# Patient Record
Sex: Male | Born: 1975 | Race: Black or African American | Hispanic: No | Marital: Married | Smoking: Former smoker
Health system: Southern US, Community
[De-identification: ages and names within clinical notes are randomized; demographics above are authoritative.]

---

## 2007-07-13 ENCOUNTER — Ambulatory Visit: Payer: Self-pay | Admitting: Internal Medicine

## 2007-07-15 ENCOUNTER — Emergency Department: Payer: Self-pay | Admitting: Emergency Medicine

## 2007-07-19 ENCOUNTER — Emergency Department: Payer: Self-pay | Admitting: Emergency Medicine

## 2007-07-20 ENCOUNTER — Ambulatory Visit: Payer: Self-pay | Admitting: Family Medicine

## 2007-07-21 ENCOUNTER — Ambulatory Visit: Payer: Self-pay | Admitting: Family Medicine

## 2010-01-07 IMAGING — CR RIGHT FOOT COMPLETE - 3+ VIEW
1 series · 3 of 3 positions shown · non-contrast
Comparison: none

REASON FOR EXAM: swelling pain
COMMENTS:   LMP: (Male)

[Series 1: view not recorded · 0.17mm/px · 3 of 3 slices shown]
[im 1/3]
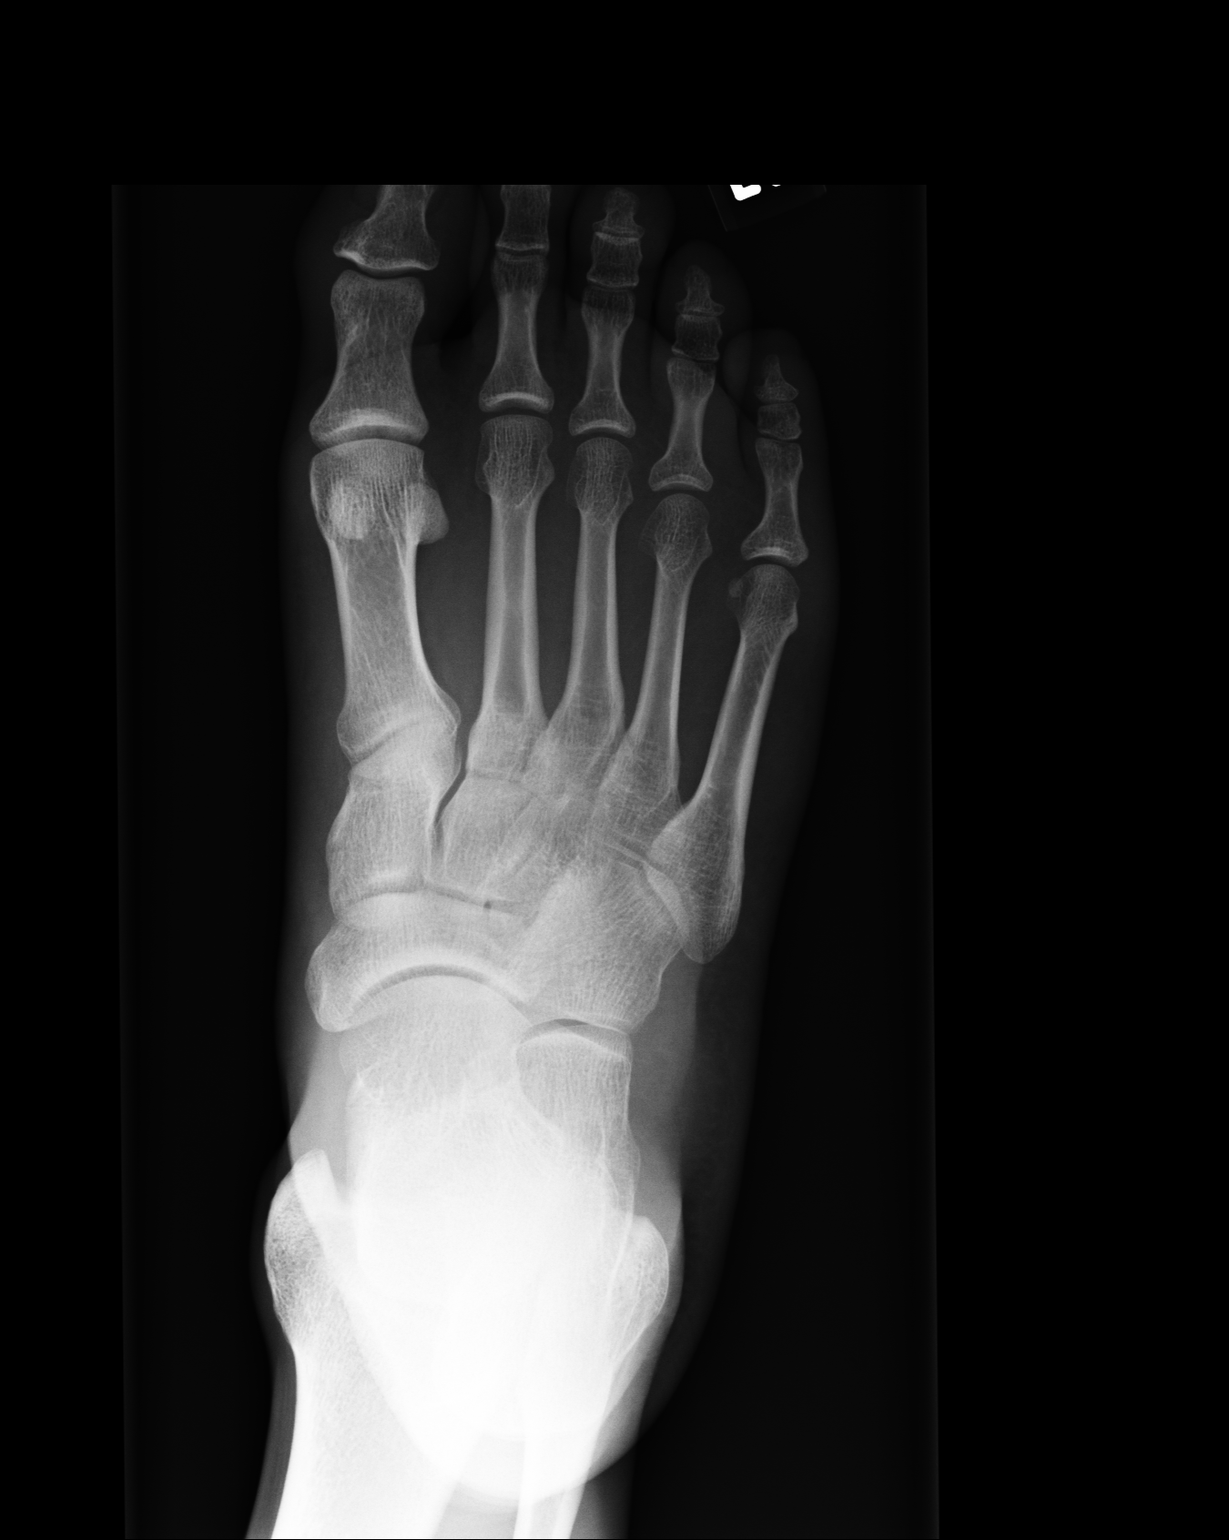
[im 2/3]
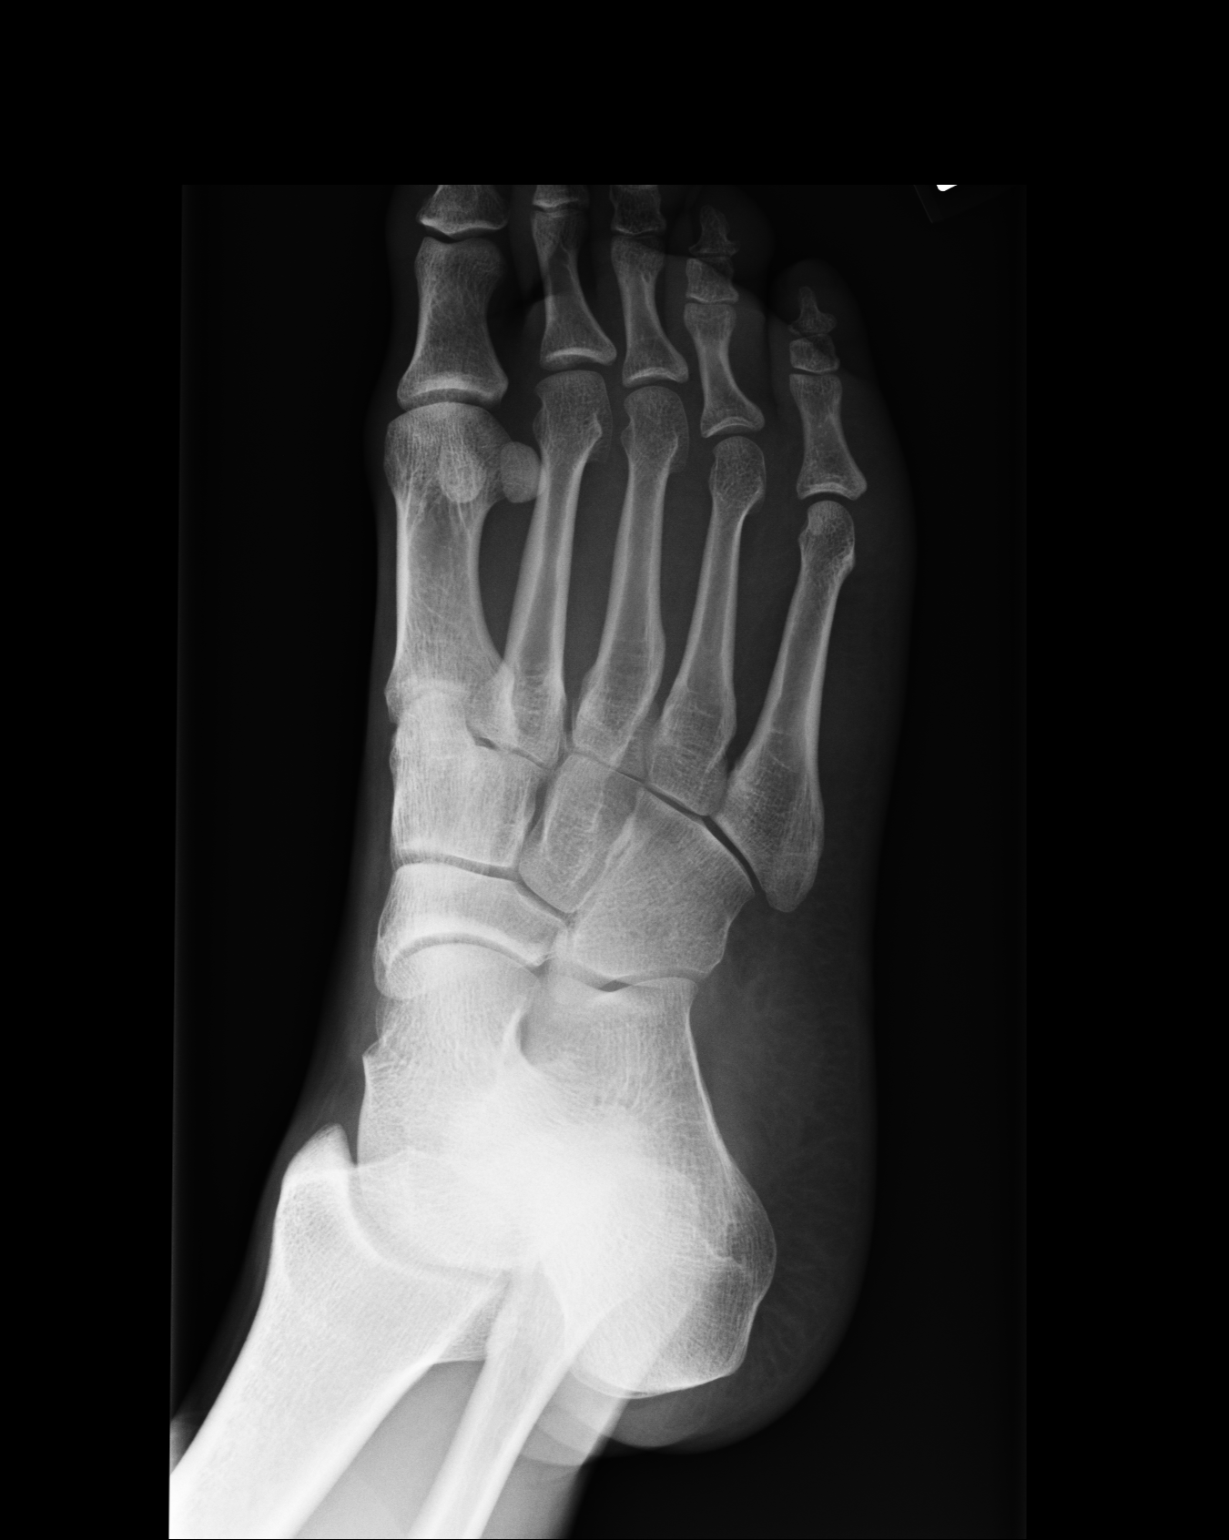
[im 3/3]
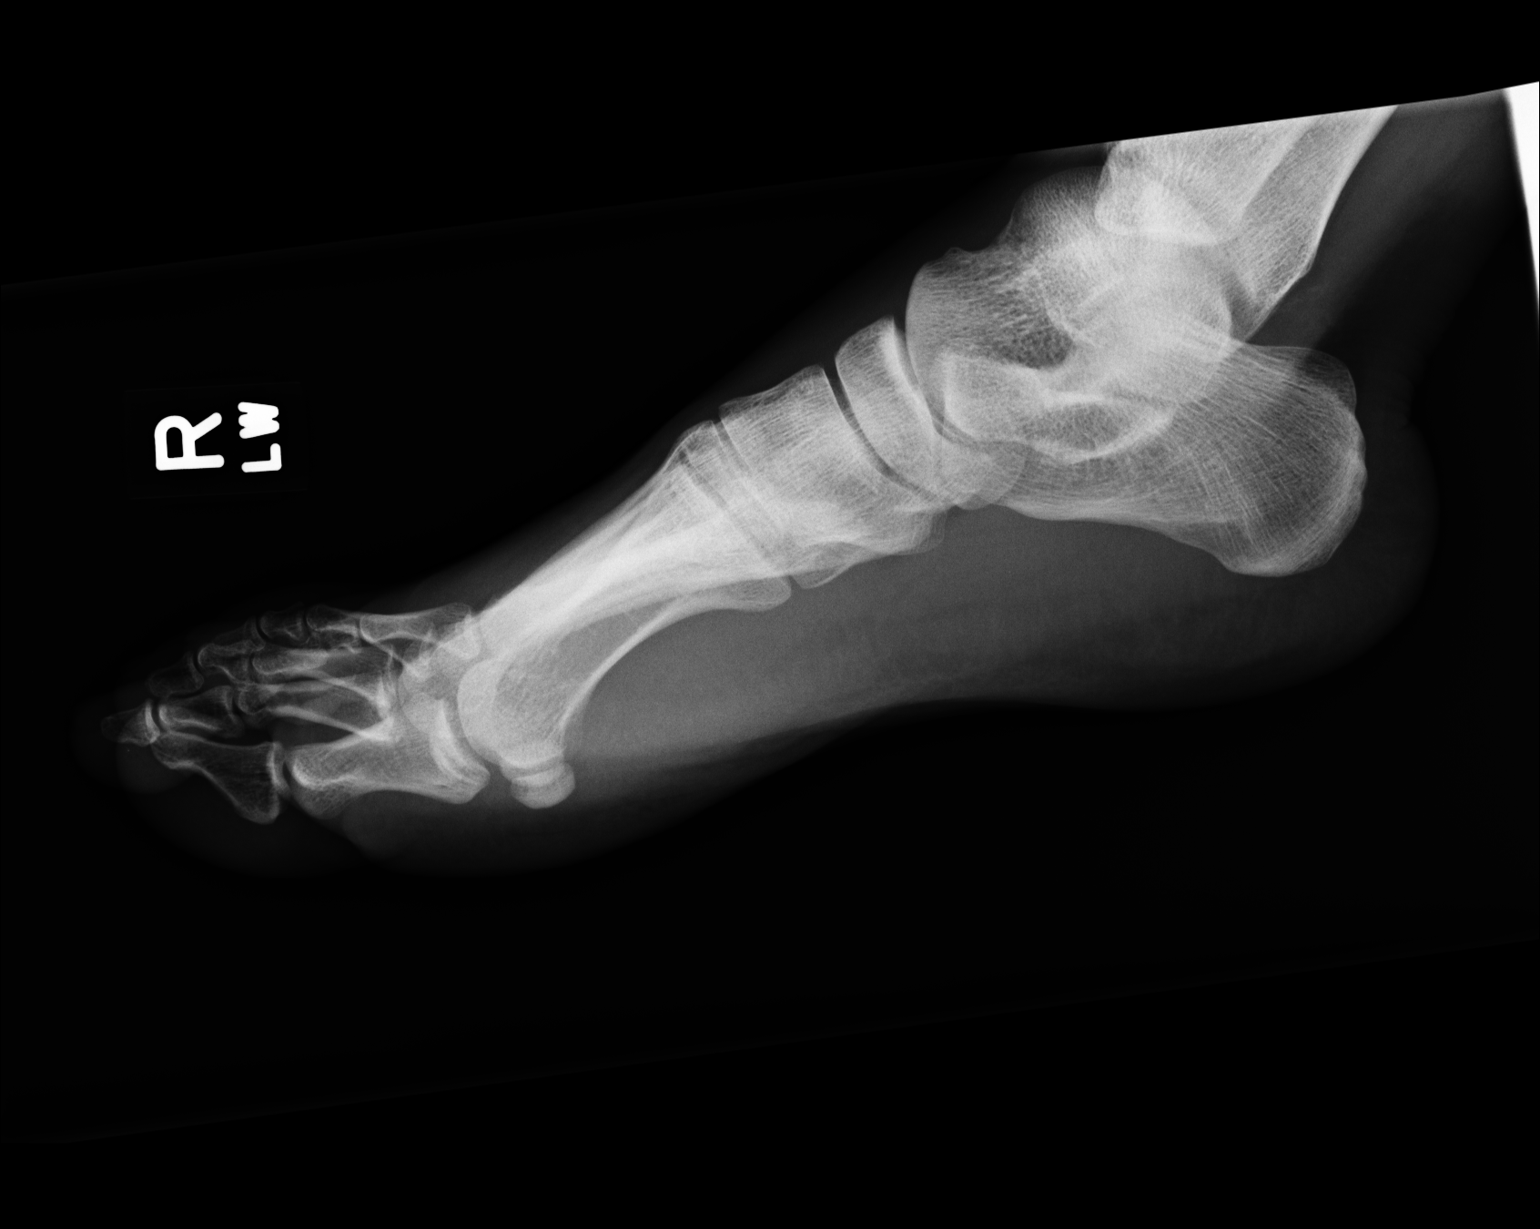

[3 of 3 positions shown; findings below may reference images not displayed]

PROCEDURE:     MDR - MDR FOOT RT COMP W/OBLIQUES  - July 13, 2007  [DATE]

RESULT:     The patient reports pain and swelling of the foot. I do not have
a history of trauma.

The bones of the foot appear adequately mineralized. I do not see evidence
of an acute fracture. Specific attention to the metatarsal bases reveals no
acute abnormality. The overlying soft tissues are mildly prominent over the
forefoot.
IMPRESSION: I do not see acute bony abnormality of the RIGHT foot. There is soft tissue
swelling over the forefoot. Correlation with the patient's clinical exam
will be needed. Followup imaging is available on request.

## 2011-08-02 ENCOUNTER — Emergency Department: Payer: Self-pay | Admitting: Internal Medicine

## 2012-11-19 ENCOUNTER — Emergency Department: Payer: Self-pay | Admitting: Emergency Medicine

## 2013-01-17 ENCOUNTER — Emergency Department: Payer: Self-pay | Admitting: Emergency Medicine

## 2013-01-17 LAB — RAPID INFLUENZA A&B ANTIGENS

## 2013-01-21 ENCOUNTER — Emergency Department: Payer: Self-pay | Admitting: Emergency Medicine

## 2013-01-21 LAB — BASIC METABOLIC PANEL
BUN: 16 mg/dL (ref 7–18)
Calcium, Total: 8.8 mg/dL (ref 8.5–10.1)
Chloride: 103 mmol/L (ref 98–107)
Creatinine: 1.53 mg/dL — ABNORMAL HIGH (ref 0.60–1.30)
EGFR (Non-African Amer.): 57 — ABNORMAL LOW
Osmolality: 267 (ref 275–301)
Potassium: 3.9 mmol/L (ref 3.5–5.1)
Sodium: 133 mmol/L — ABNORMAL LOW (ref 136–145)

## 2013-01-21 LAB — CBC
HCT: 46.7 % (ref 40.0–52.0)
HGB: 16 g/dL (ref 13.0–18.0)
MCH: 30.8 pg (ref 26.0–34.0)
MCV: 90 fL (ref 80–100)
Platelet: 159 10*3/uL (ref 150–440)
WBC: 3.9 10*3/uL (ref 3.8–10.6)

## 2013-01-27 ENCOUNTER — Institutional Professional Consult (permissible substitution): Payer: Self-pay | Admitting: Internal Medicine

## 2013-01-31 ENCOUNTER — Institutional Professional Consult (permissible substitution): Payer: Self-pay | Admitting: Internal Medicine

## 2013-02-04 ENCOUNTER — Encounter: Payer: Self-pay | Admitting: Internal Medicine

## 2013-11-23 IMAGING — CR DG CHEST 2V
1 series · 2 of 2 positions shown · non-contrast
Comparison: Prior chest x-ray 01/17/2013

CLINICAL DATA: Short of breath and lightheaded

EXAM:
CHEST  2 VIEW

[Series 1: pa · 0.17mm/px · 2 of 2 slices shown]
[im 1/2]
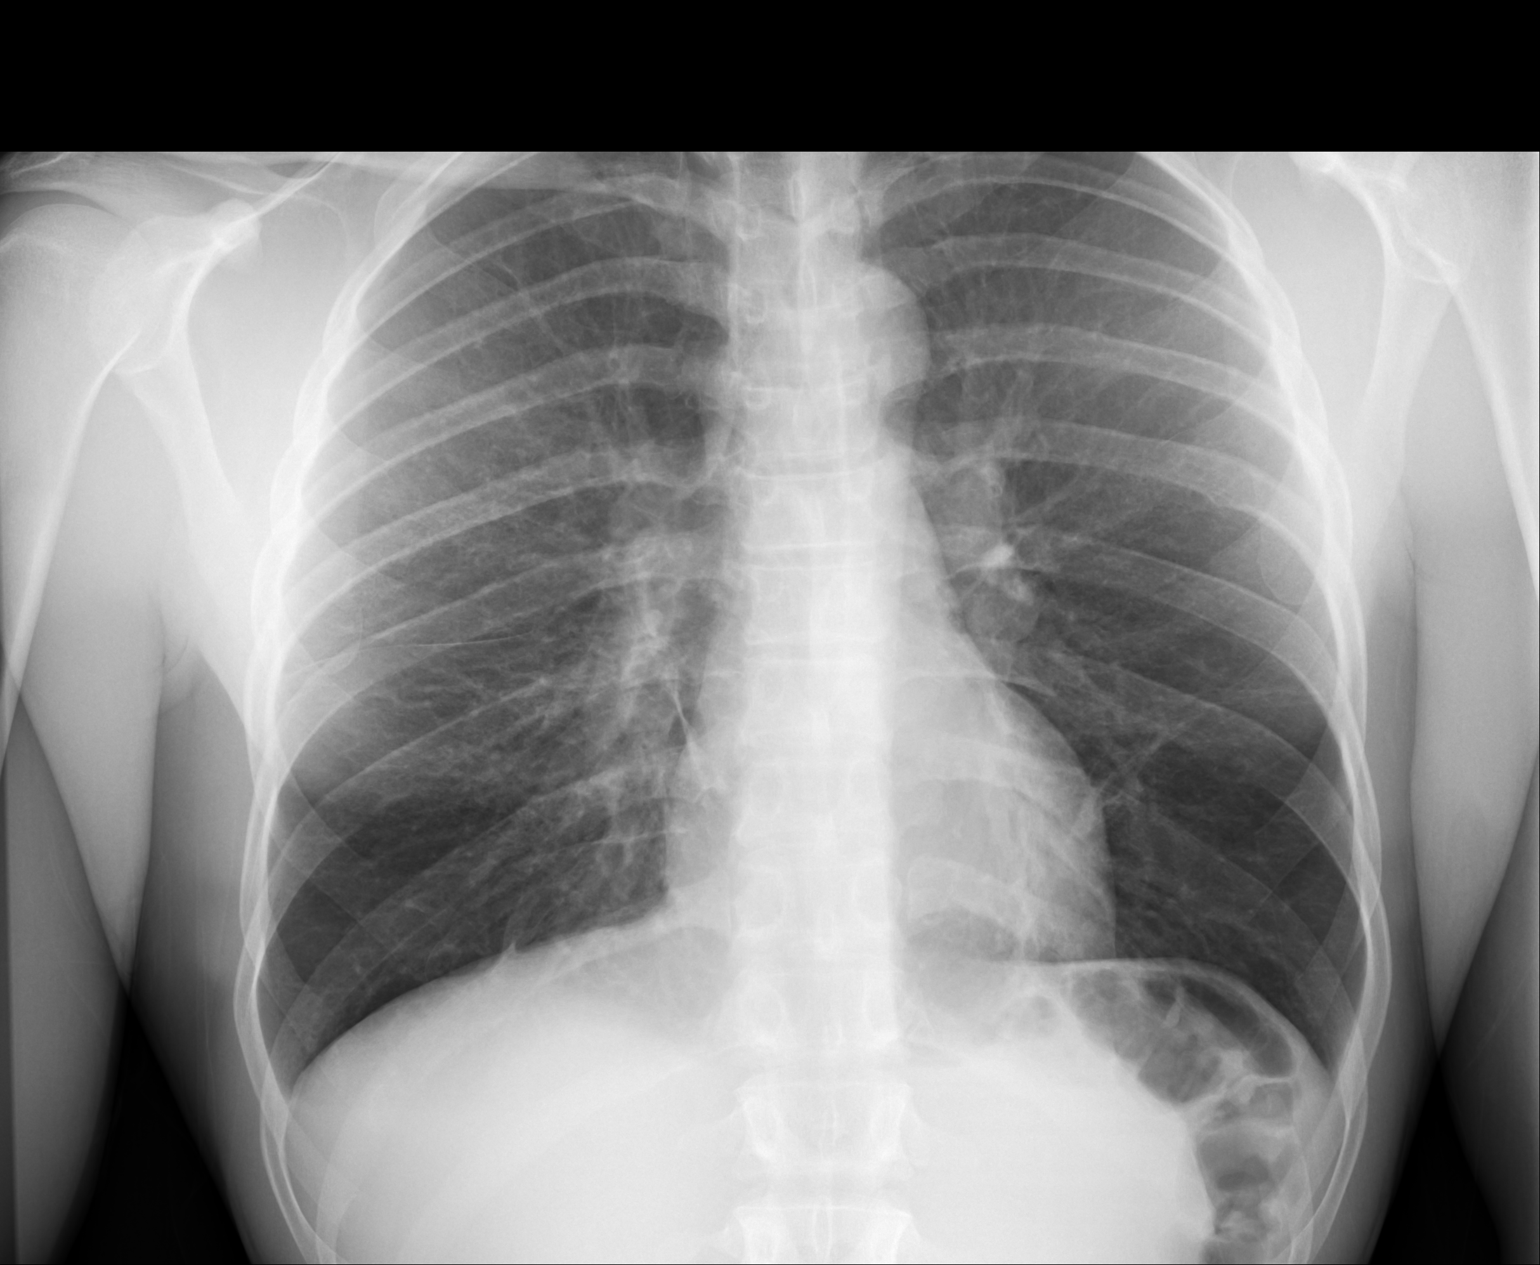
[im 2/2]
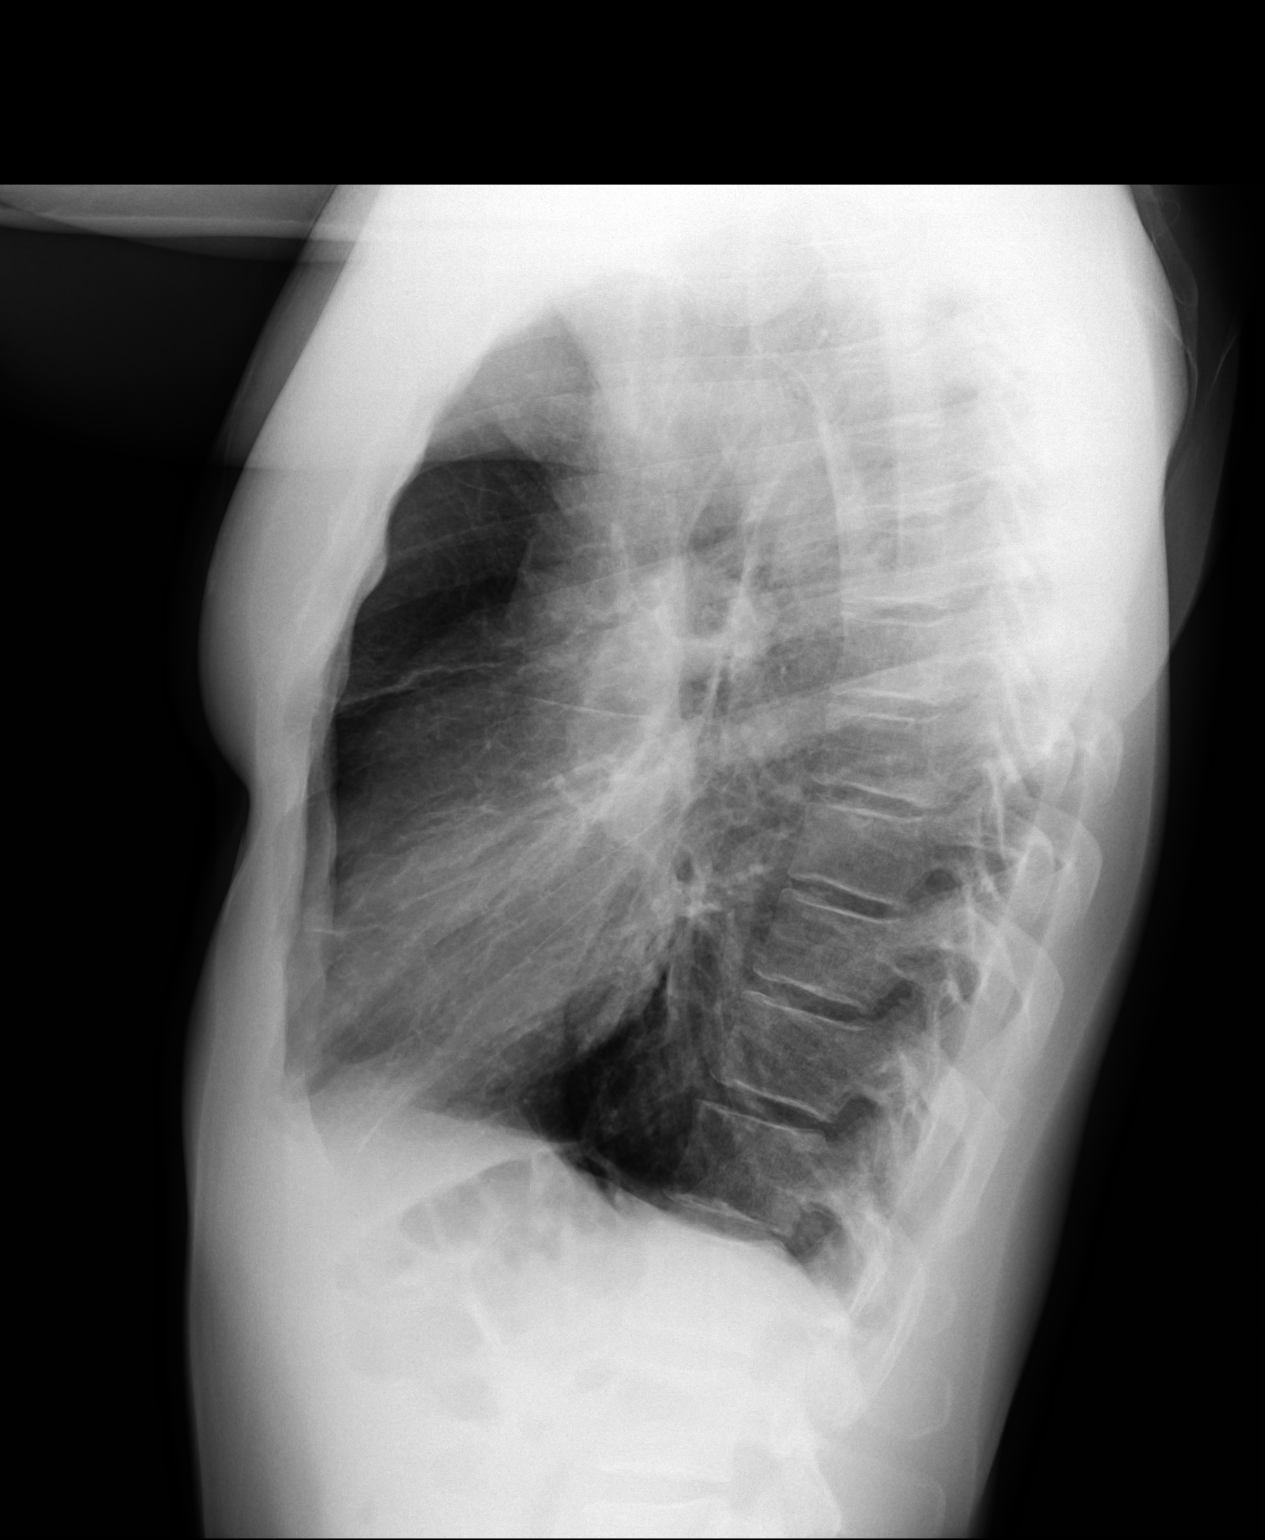

[2 of 2 positions shown; findings below may reference images not displayed]

FINDINGS: The lungs are clear and negative for focal airspace consolidation,
pulmonary edema or suspicious pulmonary nodule. The previously
described medial right lower lobe airspace disease is not well seen
on today's examination and may have cleared. No pleural effusion or
pneumothorax. Trace chronic lingular atelectasis versus scar.
Cardiac and mediastinal contours are within normal limits. No acute
fracture or lytic or blastic osseous lesions. The visualized upper
abdominal bowel gas pattern is unremarkable.
IMPRESSION: No active cardiopulmonary disease.

## 2015-07-15 IMAGING — CR DG CHEST 2V
1 series · 2 of 2 positions shown · non-contrast
Comparison: None.

CLINICAL DATA: Cough congestion and fever.

EXAM:
CHEST  2 VIEW

[Series 1: w chest pa · 0.14mm/px · 2 of 2 slices shown]
[im 1/2]
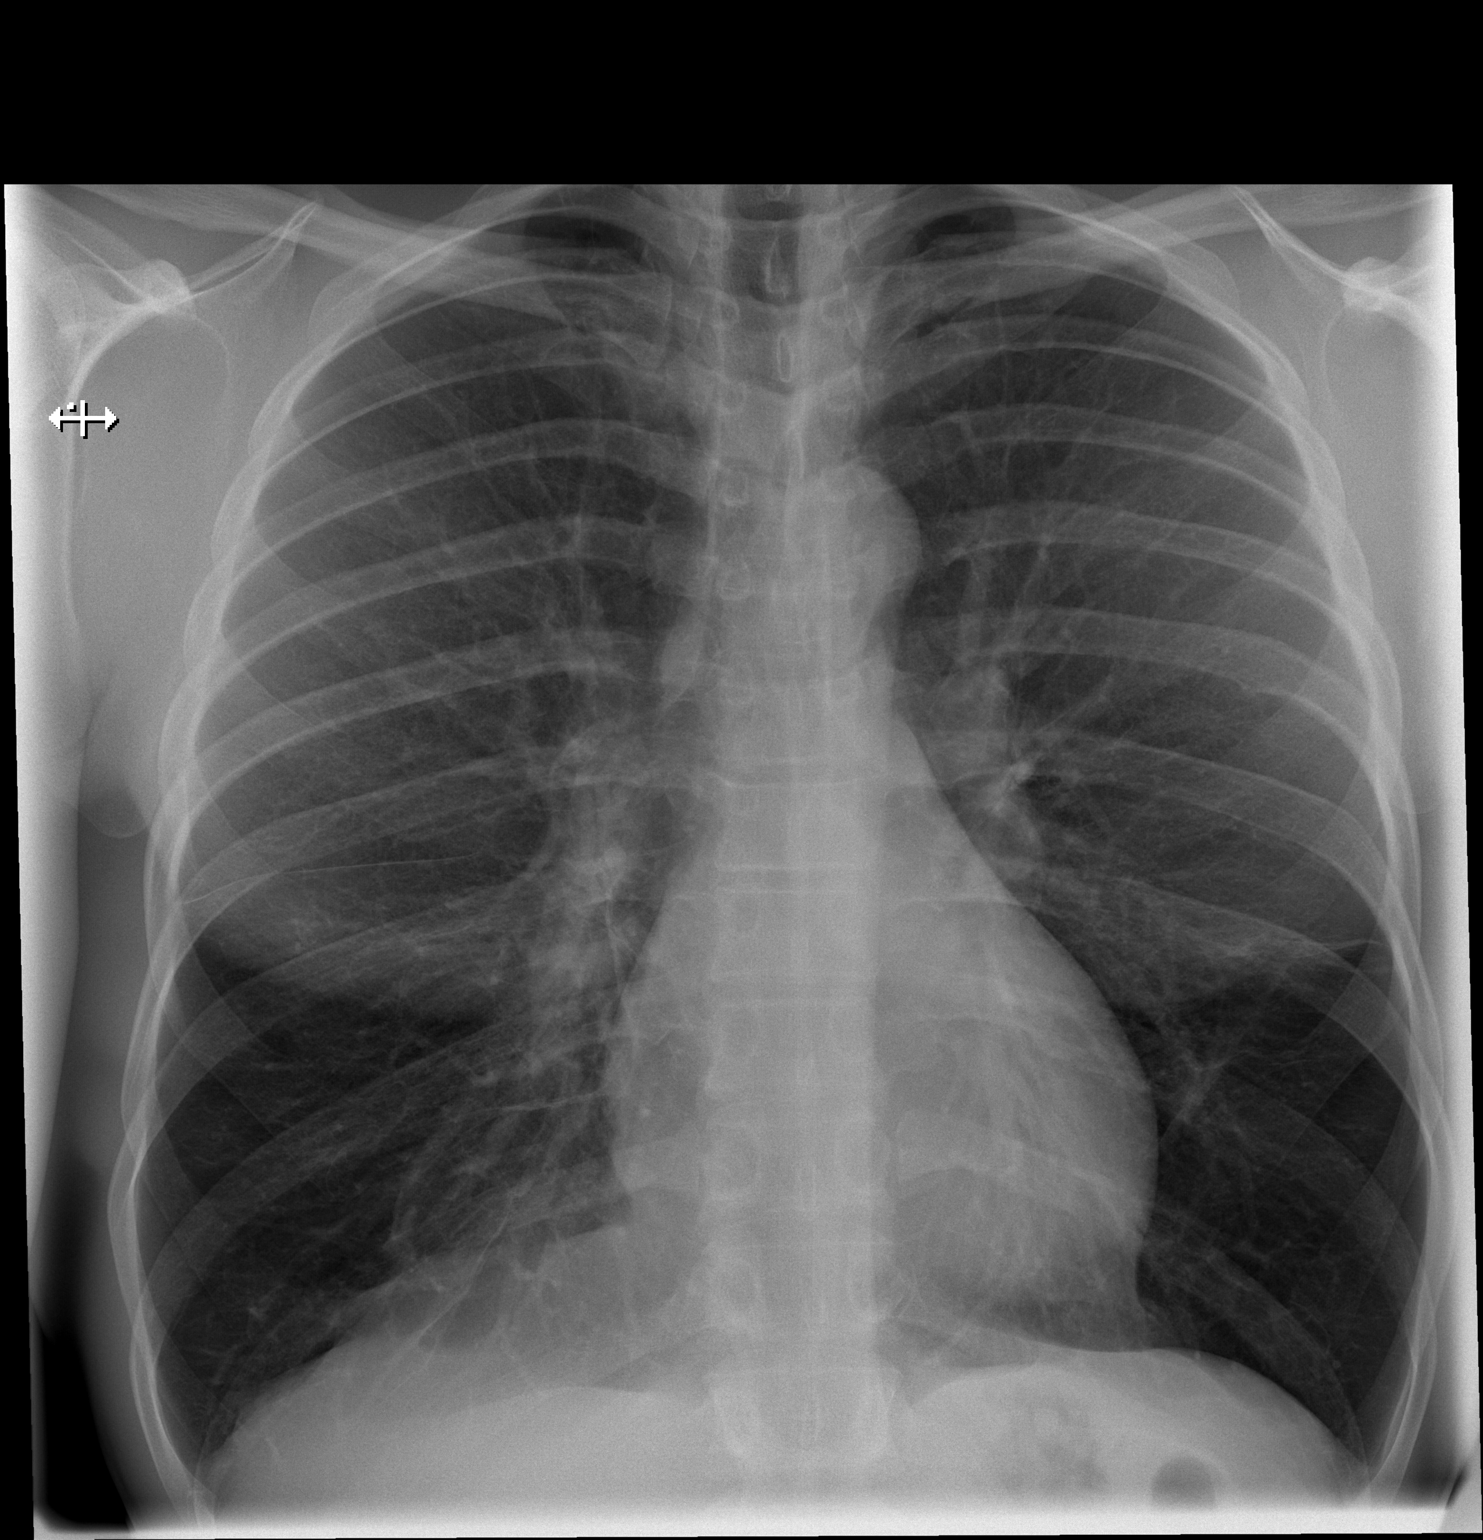
[im 2/2]
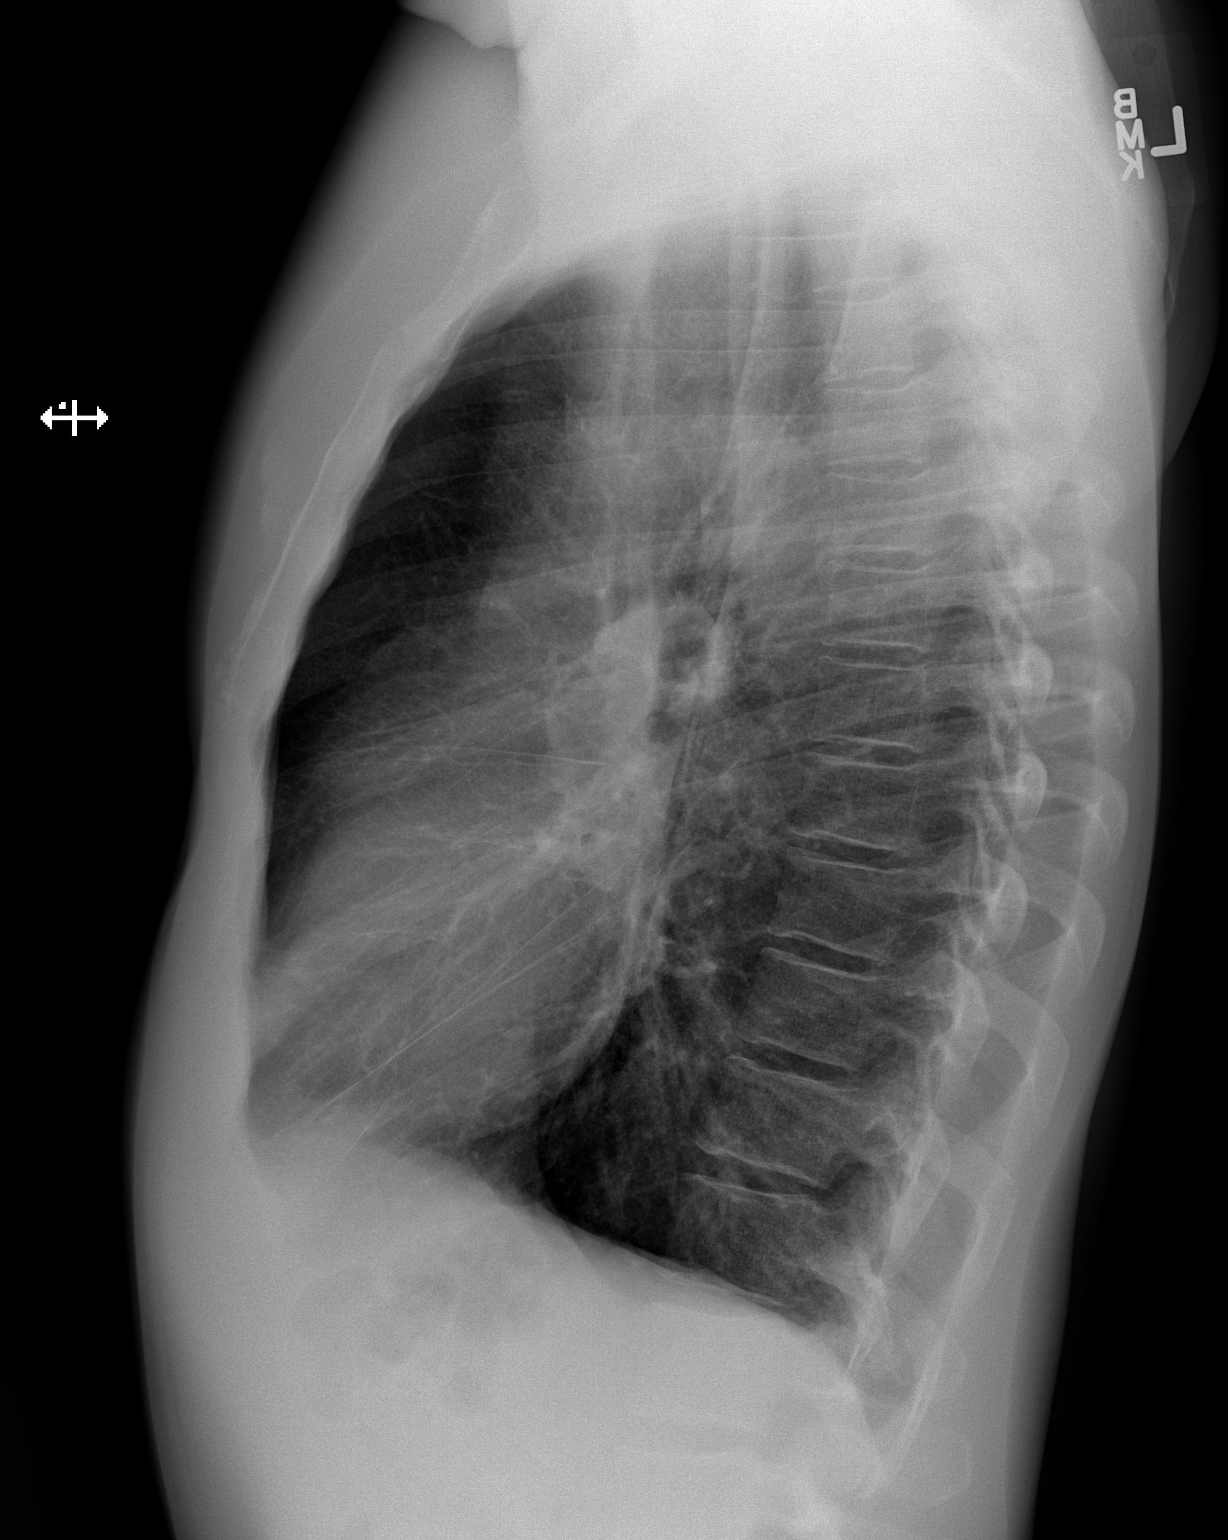

[2 of 2 positions shown; findings below may reference images not displayed]

FINDINGS: The heart size is normal. Medial right middle lobe airspace disease
is evident. The lungs are hyperexpanded. No other focal airspace
disease is evident.
IMPRESSION: 1. Medial right lower lobe airspace disease. This is most concerning
for pneumonia in the setting of the patient's symptoms.
2. Hyperexpansion of the lungs and flattening of the hemidiaphragms
is compatible with COPD.

## 2016-09-25 ENCOUNTER — Other Ambulatory Visit: Payer: Self-pay | Admitting: Internal Medicine

## 2016-09-25 DIAGNOSIS — R748 Abnormal levels of other serum enzymes: Secondary | ICD-10-CM

## 2019-06-06 ENCOUNTER — Ambulatory Visit: Payer: Managed Care, Other (non HMO) | Attending: Internal Medicine

## 2019-06-06 DIAGNOSIS — Z23 Encounter for immunization: Secondary | ICD-10-CM

## 2019-06-06 NOTE — Progress Notes (Signed)
   Covid-19 Vaccination Clinic  Name:  Mitchell Foster    MRN: [id number] DOB: 1975-10-29  06/06/2019  [name] was observed post Covid-19 immunization for 15 minutes without incident. He was provided with Vaccine Information Sheet and instruction to access the V-Safe system.   [name] was instructed to call 911 with any severe reactions post vaccine: Marland Kitchen Difficulty breathing  . Swelling of face and throat  . A fast heartbeat  . A bad rash all over body  . Dizziness and weakness   Immunizations Administered    Name Date Dose VIS Date Route   Pfizer COVID-19 Vaccine 06/06/2019  9:44 AM 0.3 mL 02/21/2019 Intramuscular   Manufacturer: [employer], [employer]   Lot: [id number]   NDC: [address]-2

## 2019-07-01 ENCOUNTER — Ambulatory Visit: Payer: Managed Care, Other (non HMO) | Attending: Internal Medicine

## 2019-07-01 DIAGNOSIS — Z23 Encounter for immunization: Secondary | ICD-10-CM

## 2019-07-01 NOTE — Progress Notes (Signed)
   Covid-19 Vaccination Clinic  Name:  BUEFORD ARP    MRN: [id number] DOB: June 06, 1975  07/01/2019  Mr. Mages was observed post Covid-19 immunization for 15 minutes without incident. He was provided with Vaccine Information Sheet and instruction to access the V-Safe system.   Mr. Bohnenkamp was instructed to call 911 with any severe reactions post vaccine: Marland Kitchen Difficulty breathing  . Swelling of face and throat  . A fast heartbeat  . A bad rash all over body  . Dizziness and weakness   Immunizations Administered    Name Date Dose VIS Date Route   Pfizer COVID-19 Vaccine 07/01/2019  9:17 AM 0.3 mL 05/07/2018 Intramuscular   Manufacturer: [employer], [employer]   Lot: [id number]   NDC: [address]-0

## 2022-02-06 ENCOUNTER — Ambulatory Visit
Admission: EM | Admit: 2022-02-06 | Discharge: 2022-02-06 | Disposition: A | Discharge status: Home / Self Care | Payer: Managed Care, Other (non HMO) | Attending: Urgent Care | Admitting: Urgent Care

## 2022-02-06 DIAGNOSIS — A09 Infectious gastroenteritis and colitis, unspecified: Secondary | ICD-10-CM | POA: Diagnosis present

## 2022-02-06 DIAGNOSIS — F411 Generalized anxiety disorder: Secondary | ICD-10-CM | POA: Diagnosis present

## 2022-02-06 DIAGNOSIS — R197 Diarrhea, unspecified: Secondary | ICD-10-CM | POA: Diagnosis present

## 2022-02-06 LAB — COMPREHENSIVE METABOLIC PANEL
ALT: 31 U/L (ref 0–44)
AST: 38 U/L (ref 15–41)
Albumin: 4.4 g/dL (ref 3.5–5.0)
Alkaline Phosphatase: 81 U/L (ref 38–126)
Anion gap: 10 (ref 5–15)
BUN: 10 mg/dL (ref 6–20)
CO2: 20 mmol/L — ABNORMAL LOW (ref 22–32)
Calcium: 9.1 mg/dL (ref 8.9–10.3)
Chloride: 105 mmol/L (ref 98–111)
Creatinine, Ser: 1.23 mg/dL (ref 0.61–1.24)
GFR, Estimated: >60 mL/min (ref >60)
Glucose, Bld: 111 mg/dL — ABNORMAL HIGH (ref 70–99)
Potassium: 3.6 mmol/L (ref 3.5–5.1)
Sodium: 135 mmol/L (ref 135–145)
Total Bilirubin: 1 mg/dL (ref 0.3–1.2)
Total Protein: 8 g/dL (ref 6.5–8.1)

## 2022-02-06 LAB — CBC WITH DIFFERENTIAL/PLATELET
Abs Immature Granulocytes: 0.03 10*3/uL (ref 0.00–0.07)
Basophils Absolute: 0 10*3/uL (ref 0.0–0.1)
Basophils Relative: 0 %
Eosinophils Absolute: 0.1 10*3/uL (ref 0.0–0.5)
Eosinophils Relative: 1 %
HCT: 41 % (ref 39.0–52.0)
Hemoglobin: 13.6 g/dL (ref 13.0–17.0)
Immature Granulocytes: 0 %
Lymphocytes Relative: 35 %
Lymphs Abs: 2.8 10*3/uL (ref 0.7–4.0)
MCH: 30.5 pg (ref 26.0–34.0)
MCHC: 33.2 g/dL (ref 30.0–36.0)
MCV: 91.9 fL (ref 80.0–100.0)
Monocytes Absolute: 0.6 10*3/uL (ref 0.1–1.0)
Monocytes Relative: 8 %
Neutro Abs: 4.3 10*3/uL (ref 1.7–7.7)
Neutrophils Relative %: 56 %
Platelets: 249 10*3/uL (ref 150–400)
RBC: 4.46 MIL/uL (ref 4.22–5.81)
RDW: 13.8 % (ref 11.5–15.5)
WBC: 7.9 10*3/uL (ref 4.0–10.5)
nRBC: 0 % (ref 0.0–0.2)

## 2022-02-06 LAB — LIPASE, BLOOD: Lipase: 36 U/L (ref 11–51)

## 2022-02-06 MED ORDER — HYDROXYZINE HCL 25 MG PO TABS: 25.0000 mg | ORAL_TABLET | Freq: Four times a day (QID) | ORAL | 0 refills | Status: AC

## 2022-02-06 NOTE — ED Provider Notes (Signed)
[facility]    CSN: [id number] Arrival date & time: 02/06/22  1622      History   Chief Complaint Chief Complaint  Patient presents with   Abdominal Pain   Diarrhea    HPI Mitchell Foster is a 46 y.o. male.    Abdominal Pain Associated symptoms: diarrhea   Diarrhea Associated symptoms: abdominal pain     Patient reports to urgent care with complaint of abdominal pain, vomiting and diarrhea.  Symptoms since yesterday.  Symptoms started with both vomiting and diarrhea yesterday.  Vomiting has resolved and only diarrhea remains.  He reports the death of his mom earlier this year and drinking more alcohol than usual.  He endorses drinking about 5 or 6 shots of bourbon or tequila a day since October of last year.  He also endorses having a bruise on the left side of his chest.  Denies injury or known precipitating event.  He endorses symptoms of panic and anxiety associated with thought patterns regarding the death of his mother.  Patient did not provide any additional information regarding the circumstances of her death but seems to be troubled possibly regarding their relationship or interaction at the time.  History reviewed. No pertinent past medical history.  There are no problems to display for this patient.   History reviewed. No pertinent surgical history.     Home Medications    Prior to Admission medications   Medication Sig Start Date End Date Taking? Authorizing Provider  ALPRAZolam (XANAX) 0.25 MG tablet Take 0.25 mg by mouth at bedtime as needed. 10/11/21  Yes [provider]  FLUoxetine (PROZAC) 40 MG capsule Take by mouth. 11/25/21 11/25/22 Yes [provider]  pantoprazole (PROTONIX) 40 MG tablet Take 40 mg by mouth daily. 10/09/21  Yes [provider]  hydrOXYzine (ATARAX) 25 MG tablet Take 25 mg by mouth daily as needed.    [provider]    Family History History reviewed. No pertinent family  history.  Social History Social History   Tobacco Use   Smoking status: Former    Types: Cigarettes, Cigars   Smokeless tobacco: Never  Vaping Use   Vaping Use: Never used  Substance Use Topics   Alcohol use: Yes   Drug use: Never     Allergies   Patient has no known allergies.   Review of Systems Review of Systems  Gastrointestinal:  Positive for abdominal pain and diarrhea.     Physical Exam Triage Vital Signs ED Triage Vitals  Enc Vitals Group     BP 02/06/22 1711 139/74     Pulse Rate 02/06/22 1711 90     Resp --      Temp 02/06/22 1711 98.9 F (37.2 C)     Temp Source 02/06/22 1711 Oral     SpO2 02/06/22 1711 92 %     Weight 02/06/22 1708 155 lb (70.3 kg)     Height 02/06/22 1708 5\' 7"  (1.702 m)     Head Circumference --      Peak Flow --      Pain Score 02/06/22 1708 10     Pain Loc --      Pain Edu? --      Excl. in GC? --    No data found.  Updated Vital Signs BP 139/74 (BP Location: Left Arm)   Pulse 90   Temp 98.9 F (37.2 C) (Oral)   Ht 5\' 7"  (1.702 m)   Wt 155 lb (  70.3 kg)   SpO2 92%   BMI 24.28 kg/m   Visual Acuity Right Eye Distance:   Left Eye Distance:   Bilateral Distance:    Right Eye Near:   Left Eye Near:    Bilateral Near:     Physical Exam Vitals reviewed.  Constitutional:      Appearance: He is well-developed.  Abdominal:     General: Abdomen is flat.     Palpations: Abdomen is soft.     Tenderness: There is no abdominal tenderness.  Skin:    General: Skin is warm and dry.       Neurological:     General: No focal deficit present.     Mental Status: He is alert.  Psychiatric:        Attention and Perception: Attention normal.        Mood and Affect: Mood is anxious.        Speech: Speech normal.        Behavior: Behavior normal.        Cognition and Memory: Cognition normal.      UC Treatments / Results  Labs (all labs ordered are listed, but only abnormal results are displayed) Labs Reviewed - No  data to display  EKG   Radiology No results found.  Procedures Procedures (including critical care time)  Medications Ordered in UC Medications - No data to display  Initial Impression / Assessment and Plan / UC Course  I have reviewed the triage vital signs and the nursing notes.  Pertinent labs & imaging results that were available during my care of the patient were reviewed by me and considered in my medical decision making (see chart for details).  Unclear etiology for the superficial discoloration on his chest.  Patient has a prescription for hydroxyzine which he uses for acute anxiety.  He states this is somewhat effective in controlling his symptoms of panic.  Suspect viral etiology for his main symptom of nausea, vomiting and diarrhea.  Considering other etiologies including malabsorption syndromes from chronic pancreatitis (he has no left upper quadrant pain) versus hepatic (no right upper quadrant pain) disorder.  CBC and metabolic panel ordered.  CBC is largely unremarkable.  CMP is unremarkable with normal liver enzymes.  Glucose level is mildly elevated-patient is not fasting.  Will discharge patient asked him to follow-up with his primary care provider regarding his symptoms of panic and anxiety.  Likely cause of his vomiting diarrhea is viral, self-limiting.  Will encourage him to push hydration.  Final Clinical Impressions(s) / UC Diagnoses   Final diagnoses:  None   Discharge Instructions   None    ED Prescriptions   None    PDMP not reviewed this encounter.   Rose Phi, [place] 02/06/22 1805

## 2022-02-06 NOTE — ED Triage Notes (Signed)
Patient reports that his mom passed earlier this year and he has been drinking a lot more than usual.   Patient reports abdominal pain, diarrhea.

## 2022-02-06 NOTE — Discharge Instructions (Addendum)
The most likely cause of your vomiting and diarrhea is a "stomach virus".  This is self-limiting and you should expect symptoms to resolve within a day or 2.  You should focus on pushing hydration-drink plenty of water and avoid alcohol which can worsen your symptoms.  If your symptoms of diarrhea do not resolve within a few days, you can use over-the-counter Imodium following directions on the packaging.  Please follow-up as soon as possible with your primary care provider regarding your symptoms of anxiety and panic and if your diarrhea does not resolve.

## 2022-03-31 ENCOUNTER — Other Ambulatory Visit: Payer: Self-pay

## 2022-03-31 DIAGNOSIS — F411 Generalized anxiety disorder: Secondary | ICD-10-CM

## 2022-03-31 DIAGNOSIS — N289 Disorder of kidney and ureter, unspecified: Secondary | ICD-10-CM

## 2022-04-05 ENCOUNTER — Ambulatory Visit
Admission: RE | Admit: 2022-04-05 | Discharge: 2022-04-05 | Disposition: A | Discharge status: Home / Self Care | Payer: BC Managed Care – PPO | Source: Ambulatory Visit | Attending: Internal Medicine | Admitting: Internal Medicine

## 2022-04-05 DIAGNOSIS — F411 Generalized anxiety disorder: Secondary | ICD-10-CM | POA: Insufficient documentation

## 2022-04-05 DIAGNOSIS — N289 Disorder of kidney and ureter, unspecified: Secondary | ICD-10-CM | POA: Diagnosis not present

## 2022-08-14 ENCOUNTER — Ambulatory Visit: Payer: BC Managed Care – PPO

## 2022-08-14 DIAGNOSIS — K2950 Unspecified chronic gastritis without bleeding: Secondary | ICD-10-CM

## 2022-08-14 DIAGNOSIS — K640 First degree hemorrhoids: Secondary | ICD-10-CM

## 2022-08-14 DIAGNOSIS — Z1211 Encounter for screening for malignant neoplasm of colon: Secondary | ICD-10-CM | POA: Diagnosis not present
